# Patient Record
Sex: Female | Born: 1980
Health system: Southern US, Community
[De-identification: ages and names within clinical notes are randomized; demographics above are authoritative.]

---
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
## 2000-11-01 ENCOUNTER — Other Ambulatory Visit: Admission: RE | Admit: 2000-11-01 | Discharge: 2000-11-01 | Payer: Self-pay | Admitting: Gynecology

## 2006-02-20 ENCOUNTER — Emergency Department (HOSPITAL_COMMUNITY): Admission: EM | Admit: 2006-02-20 | Discharge: 2006-02-20 | Payer: Self-pay | Admitting: Emergency Medicine

## 2017-07-12 DIAGNOSIS — R002 Palpitations: Secondary | ICD-10-CM | POA: Diagnosis not present

## 2017-07-12 DIAGNOSIS — R1030 Lower abdominal pain, unspecified: Secondary | ICD-10-CM | POA: Diagnosis not present

## 2017-07-12 DIAGNOSIS — N83201 Unspecified ovarian cyst, right side: Secondary | ICD-10-CM | POA: Diagnosis not present

## 2017-07-12 DIAGNOSIS — R42 Dizziness and giddiness: Secondary | ICD-10-CM | POA: Diagnosis not present

## 2017-07-12 DIAGNOSIS — R55 Syncope and collapse: Secondary | ICD-10-CM | POA: Diagnosis not present

## 2018-07-11 DIAGNOSIS — R509 Fever, unspecified: Secondary | ICD-10-CM | POA: Diagnosis not present

## 2018-07-11 DIAGNOSIS — J029 Acute pharyngitis, unspecified: Secondary | ICD-10-CM | POA: Diagnosis not present

## 2019-02-09 ENCOUNTER — Emergency Department (HOSPITAL_COMMUNITY)
Admission: EM | Admit: 2019-02-09 | Discharge: 2019-02-09 | Disposition: A | Discharge status: Home / Self Care | Payer: Medicare Other | Attending: Emergency Medicine | Admitting: Emergency Medicine

## 2019-02-09 ENCOUNTER — Other Ambulatory Visit: Payer: Self-pay

## 2019-02-09 ENCOUNTER — Emergency Department (HOSPITAL_COMMUNITY): Payer: Medicare Other

## 2019-02-09 ENCOUNTER — Encounter (HOSPITAL_COMMUNITY): Payer: Self-pay | Admitting: Emergency Medicine

## 2019-02-09 DIAGNOSIS — Z79899 Other long term (current) drug therapy: Secondary | ICD-10-CM | POA: Diagnosis not present

## 2019-02-09 DIAGNOSIS — J069 Acute upper respiratory infection, unspecified: Secondary | ICD-10-CM | POA: Diagnosis not present

## 2019-02-09 DIAGNOSIS — J988 Other specified respiratory disorders: Secondary | ICD-10-CM | POA: Diagnosis not present

## 2019-02-09 DIAGNOSIS — B9789 Other viral agents as the cause of diseases classified elsewhere: Secondary | ICD-10-CM

## 2019-02-09 DIAGNOSIS — R0602 Shortness of breath: Secondary | ICD-10-CM | POA: Diagnosis present

## 2019-02-09 LAB — CBC WITH DIFFERENTIAL/PLATELET
Abs Immature Granulocytes: 0.02 10*3/uL (ref 0.00–0.07)
Basophils Absolute: 0 10*3/uL (ref 0.0–0.1)
Basophils Relative: 0 %
Eosinophils Absolute: 0.1 10*3/uL (ref 0.0–0.5)
Eosinophils Relative: 2 %
HCT: 38.4 % (ref 36.0–46.0)
Hemoglobin: 12.7 g/dL (ref 12.0–15.0)
Immature Granulocytes: 0 %
Lymphocytes Relative: 33 %
Lymphs Abs: 2.1 10*3/uL (ref 0.7–4.0)
MCH: 29.7 pg (ref 26.0–34.0)
MCHC: 33.1 g/dL (ref 30.0–36.0)
MCV: 89.7 fL (ref 80.0–100.0)
Monocytes Absolute: 0.6 10*3/uL (ref 0.1–1.0)
Monocytes Relative: 10 %
Neutro Abs: 3.4 10*3/uL (ref 1.7–7.7)
Neutrophils Relative %: 55 %
Platelets: 295 10*3/uL (ref 150–400)
RBC: 4.28 MIL/uL (ref 3.87–5.11)
RDW: 12.9 % (ref 11.5–15.5)
WBC: 6.3 10*3/uL (ref 4.0–10.5)
nRBC: 0 % (ref 0.0–0.2)

## 2019-02-09 LAB — BASIC METABOLIC PANEL
Anion gap: 7 (ref 5–15)
BUN: 13 mg/dL (ref 6–20)
CO2: 23 mmol/L (ref 22–32)
Calcium: 8.8 mg/dL — ABNORMAL LOW (ref 8.9–10.3)
Chloride: 110 mmol/L (ref 98–111)
Creatinine, Ser: 0.91 mg/dL (ref 0.44–1.00)
GFR calc Af Amer: >60 mL/min (ref >60)
GFR calc non Af Amer: >60 mL/min (ref >60)
Glucose, Bld: 95 mg/dL (ref 70–99)
Potassium: 3.6 mmol/L (ref 3.5–5.1)
Sodium: 140 mmol/L (ref 135–145)

## 2019-02-09 LAB — TROPONIN I: Troponin I: <0.03 ng/mL (ref <0.03)

## 2019-02-09 NOTE — ED Notes (Signed)
Bed: WA12 Expected date:  Expected time:  Means of arrival:  Comments: Negative pressure 

## 2019-02-09 NOTE — ED Triage Notes (Addendum)
Patient reports intermittent cough, SOB, fever and chest pain x1 week. Reports mother had similar sx x2 weeks ago.

## 2019-02-09 NOTE — Discharge Instructions (Signed)
You were seen in the ED today for cough, shortness of breath, fever, and chest pain. Your chest pain workup was negative today; your symptoms may be due to COVID 19. Please follow the below recommendations; stay home until you are fever free for 3 days without use of fever reducing medications as well as 7 days after symptom onset. Return to the ED if you begin having worsening shortness of breath especially at rest.      Person Under Monitoring Name: Christina PalmerJennifer B Scott  Location: [address] [place] [id number]   Infection Prevention Recommendations for Individuals Confirmed to have, or Being Evaluated for, 2019 Novel Coronavirus (COVID-19) Infection Who Receive Care at Home  Individuals who are confirmed to have, or are being evaluated for, COVID-19 should follow the prevention steps below until a healthcare provider or local or state health department says they can return to normal activities.  Stay home except to get medical care You should restrict activities outside your home, except for getting medical care. Do not go to work, school, or public areas, and do not use public transportation or taxis.  Call ahead before visiting your doctor Before your medical appointment, call the healthcare provider and tell them that you have, or are being evaluated for, COVID-19 infection. This will help the healthcare providers office take steps to keep other people from getting infected. Ask your healthcare provider to call the local or state health department.  Monitor your symptoms Seek prompt medical attention if your illness is worsening (e.g., difficulty breathing). Before going to your medical appointment, call the healthcare provider and tell them that you have, or are being evaluated for, COVID-19 infection. Ask your healthcare provider to call the local or state health department.  Wear a facemask You should wear a facemask that covers your nose and mouth when you are  in the same room with other people and when you visit a healthcare provider. People who live with or visit you should also wear a facemask while they are in the same room with you.  Separate yourself from other people in your home As much as possible, you should stay in a different room from other people in your home. Also, you should use a separate bathroom, if available.  Avoid sharing household items You should not share dishes, drinking glasses, cups, eating utensils, towels, bedding, or other items with other people in your home. After using these items, you should wash them thoroughly with soap and water.  Cover your coughs and sneezes Cover your mouth and nose with a tissue when you cough or sneeze, or you can cough or sneeze into your sleeve. Throw used tissues in a lined trash can, and immediately wash your hands with soap and water for at least 20 seconds or use an alcohol-based hand rub.  Wash your [employer]hands Wash your hands often and thoroughly with soap and water for at least 20 seconds. You can use an alcohol-based hand sanitizer if soap and water are not available and if your hands are not visibly dirty. Avoid touching your eyes, nose, and mouth with unwashed hands.   Prevention Steps for Caregivers and Household Members of Individuals Confirmed to have, or Being Evaluated for, COVID-19 Infection Being Cared for in the Home  If you live with, or provide care at home for, a person confirmed to have, or being evaluated for, COVID-19 infection please follow these guidelines to prevent infection:  Follow healthcare providers instructions Make sure that you understand  and can help the patient follow any healthcare provider instructions for all care.  Provide for the patients basic needs You should help the patient with basic needs in the home and provide support for getting groceries, prescriptions, and other personal needs.  Monitor the patients symptoms If they are  getting sicker, call his or her medical provider and tell them that the patient has, or is being evaluated for, COVID-19 infection. This will help the healthcare providers office take steps to keep other people from getting infected. Ask the healthcare provider to call the local or state health department.  Limit the number of people who have contact with the patient If possible, have only one caregiver for the patient. Other household members should stay in another home or place of residence. If this is not possible, they should stay in another room, or be separated from the patient as much as possible. Use a separate bathroom, if available. Restrict visitors who do not have an essential need to be in the home.  Keep older adults, very young children, and other sick people away from the patient Keep older adults, very young children, and those who have compromised immune systems or chronic health conditions away from the patient. This includes people with chronic heart, lung, or kidney conditions, diabetes, and cancer.  Ensure good ventilation Make sure that shared spaces in the home have good air flow, such as from an air conditioner or an opened window, weather permitting.  Wash your hands often Wash your hands often and thoroughly with soap and water for at least 20 seconds. You can use an alcohol based hand sanitizer if soap and water are not available and if your hands are not visibly dirty. Avoid touching your eyes, nose, and mouth with unwashed hands. Use disposable paper towels to dry your hands. If not available, use dedicated cloth towels and replace them when they become wet.  Wear a facemask and gloves Wear a disposable facemask at all times in the room and gloves when you touch or have contact with the patients blood, body fluids, and/or secretions or excretions, such as sweat, saliva, sputum, nasal mucus, vomit, urine, or feces.  Ensure the mask fits over your nose and mouth  tightly, and do not touch it during use. Throw out disposable facemasks and gloves after using them. Do not reuse. Wash your hands immediately after removing your facemask and gloves. If your personal clothing becomes contaminated, carefully remove clothing and launder. Wash your hands after handling contaminated clothing. Place all used disposable facemasks, gloves, and other waste in a lined container before disposing them with other household waste. Remove gloves and wash your hands immediately after handling these items.  Do not share dishes, glasses, or other household items with the patient Avoid sharing household items. You should not share dishes, drinking glasses, cups, eating utensils, towels, bedding, or other items with a patient who is confirmed to have, or being evaluated for, COVID-19 infection. After the person uses these items, you should wash them thoroughly with soap and water.  Wash laundry thoroughly Immediately remove and wash clothes or bedding that have blood, body fluids, and/or secretions or excretions, such as sweat, saliva, sputum, nasal mucus, vomit, urine, or feces, on them. Wear gloves when handling laundry from the patient. Read and follow directions on labels of laundry or clothing items and detergent. In general, wash and dry with the warmest temperatures recommended on the label.  Clean all areas the individual has used often Clean  all touchable surfaces, such as counters, tabletops, doorknobs, bathroom fixtures, toilets, phones, keyboards, tablets, and bedside tables, every day. Also, clean any surfaces that may have blood, body fluids, and/or secretions or excretions on them. Wear gloves when cleaning surfaces the patient has come in contact with. Use a diluted bleach solution (e.g., dilute bleach with 1 part bleach and 10 parts water) or a household disinfectant with a label that says EPA-registered for coronaviruses. To make a bleach solution at home, add 1  tablespoon of bleach to 1 quart (4 cups) of water. For a larger supply, add  cup of bleach to 1 gallon (16 cups) of water. Read labels of cleaning products and follow recommendations provided on product labels. Labels contain instructions for safe and effective use of the cleaning product including precautions you should take when applying the product, such as wearing gloves or eye protection and making sure you have good ventilation during use of the product. Remove gloves and wash hands immediately after cleaning.  Monitor yourself for signs and symptoms of illness Caregivers and household members are considered close contacts, should monitor their health, and will be asked to limit movement outside of the home to the extent possible. Follow the monitoring steps for close contacts listed on the symptom monitoring form.   ? If you have additional questions, contact your local health department or call the epidemiologist on call at [phone] (available 24/7). ? This guidance is subject to change. For the most up-to-date guidance from [facility], please refer to their website: [email or web address]

## 2019-02-09 NOTE — ED Provider Notes (Signed)
[facility]-EMERGENCY DEPT Provider Note   CSN: [id number] Arrival date & time: 02/09/19  1658    History   Chief Complaint Chief Complaint  Patient presents with  . Shortness of Breath    HPI Christina Scott is a 38 y.o. female who presents to the ED complaining of intermittent dry cough, shortness of breath, and fever Tmax 100.5 x 1 week. Pt also complains of substernal chest pain intermittently for the past 3 days. Pt states her daughter had a similar dry cough and fever about 2 weeks ago prior to pt's mother having it. Pt has been staying at home and self isolating but was concerned about the chest pain which brought her in. No radiation of pain. No vomiting. No hx of CAD. No prolonged travel, immobilization, injury to the chest. Pt is not on estrogen therapy. She is a non smoker. No hx of active malignancy.       History reviewed. No pertinent past medical history.  There are no active problems to display for this patient.   Past Surgical History:  Procedure Laterality Date  . CESAREAN SECTION       OB History   No obstetric history on file.      Home Medications    Prior to Admission medications   Medication Sig Start Date End Date Taking? Authorizing Provider  acetaminophen (TYLENOL) 325 MG tablet Take 325 mg by mouth daily as needed for fever or headache.   Yes [provider]  aspirin 325 MG EC tablet Take 325 mg by mouth daily.   Yes [provider]  SUMAtriptan (IMITREX) 20 MG/ACT nasal spray Place 20 mg into the nose every 2 (two) hours as needed for migraine or headache. May repeat in 2 hours if headache persists or recurs.   Yes [provider]    Family History No family history on file.  Social History Social History   Tobacco Use  . Smoking status: Not on file  Substance Use Topics  . Alcohol use: Not on file  . Drug use: Not on file     Allergies   Sulfa antibiotics and Hydrocodone    Review of Systems Review of Systems  Constitutional: Positive for fever. Negative for chills.  HENT: Negative for congestion and sore throat.   Eyes: Negative for redness.  Respiratory: Positive for cough and shortness of breath.   Cardiovascular: Positive for chest pain. Negative for palpitations and leg swelling.  Gastrointestinal: Negative for abdominal pain, diarrhea, nausea and vomiting.  Musculoskeletal: Negative for myalgias.  Skin: Negative for rash.  Allergic/Immunologic: Negative for immunocompromised state.  Hematological: Does not bruise/bleed easily.     Physical Exam Updated Vital Signs BP (!) 151/84   Pulse 98   Temp 97.8 F (36.6 C) (Oral)   Resp 20   SpO2 100%   Physical Exam Vitals signs and nursing note reviewed.  Constitutional:      Appearance: She is not ill-appearing.  HENT:     Head: Normocephalic and atraumatic.  Eyes:     Conjunctiva/sclera: Conjunctivae normal.  Neck:     Musculoskeletal: Neck supple.  Cardiovascular:     Rate and Rhythm: Normal rate and regular rhythm.     Pulses: Normal pulses.  Pulmonary:     Effort: Pulmonary effort is normal.     Breath sounds: Normal breath sounds. No decreased breath sounds, wheezing, rhonchi or rales.  Chest:     Chest wall: Tenderness present.    Abdominal:  Palpations: Abdomen is soft.     Tenderness: There is no abdominal tenderness.  Skin:    General: Skin is warm and dry.  Neurological:     Mental Status: She is alert.      ED Treatments / Results  Labs (all labs ordered are listed, but only abnormal results are displayed) Labs Reviewed  BASIC METABOLIC PANEL - Abnormal; Notable for the following components:      Result Value   Calcium 8.8 (*)    All other components within normal limits  TROPONIN I  CBC WITH DIFFERENTIAL/PLATELET    EKG EKG Interpretation  Date/Time:  Thursday February 09 2019 17:34:26 EDT Ventricular Rate:  67 PR Interval:    QRS Duration: 88 QT  Interval:  406 QTC Calculation: 429 R Axis:   53 Text Interpretation:  Sinus rhythm Borderline T abnormalities, anterior leads No significant change since last tracing Confirmed by Linwood DibblesKnapp, Jon [phone]([id number]) on 02/09/2019 5:54:25 PM   Radiology Dg Chest Port 1 View  Result Date: 02/09/2019 CLINICAL DATA:  Intermittent cough shortness of breath fever and chest pain EXAM: PORTABLE CHEST 1 VIEW COMPARISON:  02/20/2006 FINDINGS: The heart size and mediastinal contours are within normal limits. Both lungs are clear. The visualized skeletal structures are unremarkable. IMPRESSION: No active disease. Electronically Signed   By: Jasmine PangKim  Fujinaga M.D.   On: 02/09/2019 18:19    Procedures Procedures (including critical care time)  Medications Ordered in ED Medications - No data to display   Initial Impression / Assessment and Plan / ED Course  I have reviewed the triage vital signs and the nursing notes.  Pertinent labs & imaging results that were available during my care of the patient were reviewed by me and considered in my medical decision making (see chart for details).    Pt is a 38 year old female who presents to the ED with cough, SOB, fever, and chest pain. Chest pain reproducible. Has had contact with family members with similar symptoms; high likelihood that pt has Covid. Satting 100% on RA. LCTAB. Pt was able to ambulate by herself to her room without issue. Will workup chest pain to rule out ACS prior to discharge although low suspicion for it at this time. Likely having pain from coughing. PERC negative; no need for d-dimer.   EKG without signs of ischemia; CXR without acute abnormality; troponin negative and remainder of labs within normal limits. Do not feel pt needs delta trop at this time; will discharge home with diagnosis of viral like illness and strict instructions to self isolate for 3 days without fever/improvement of symptoms and at least 7 days after symptom onset. Return precautions  discussed with her including worsening shortness of breath. Pt in agreement with plan at this time and stable for discharge home.   Christina Scott was evaluated in Emergency Department on 02/09/2019 for the symptoms described in the history of present illness. She was evaluated in the context of the global COVID-19 pandemic, which necessitated consideration that the patient might be at risk for infection with the SARS-CoV-2 virus that causes COVID-19. Institutional protocols and algorithms that pertain to the evaluation of patients at risk for COVID-19 are in a state of rapid change based on information released by regulatory bodies including the CDC and federal and state organizations. These policies and algorithms were followed during the patient's care in the ED.        Final Clinical Impressions(s) / ED Diagnoses   Final diagnoses:  Viral  respiratory illness    ED Discharge Orders    None       Tanda Rockers, Cordelia Poche 02/09/19 1931    Linwood Dibbles, MD 02/09/19 [phone]

## 2020-01-27 IMAGING — DX PORTABLE CHEST - 1 VIEW
1 series · 1 of 1 positions shown · non-contrast
Comparison: 02/20/2006

CLINICAL DATA: Intermittent cough shortness of breath fever and
chest pain

EXAM:
PORTABLE CHEST 1 VIEW

[chest ap]
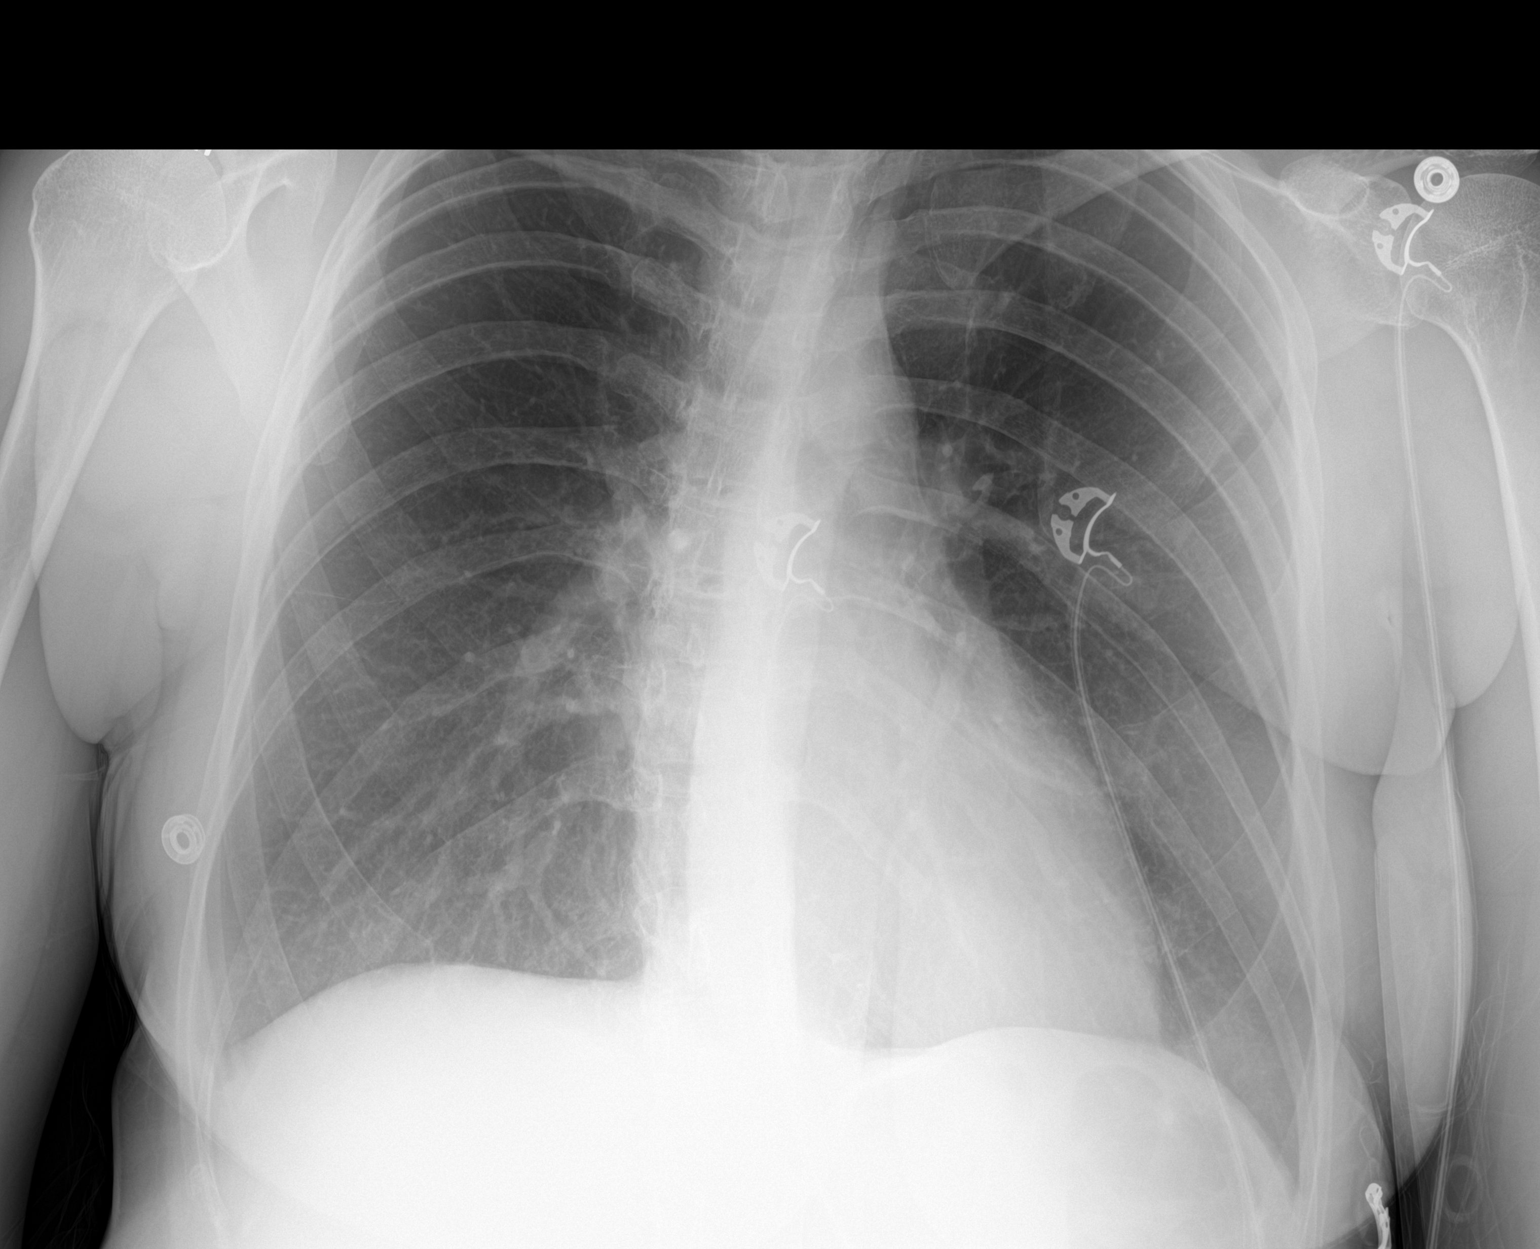

[1 of 1 positions shown; findings below may reference images not displayed]

FINDINGS: The heart size and mediastinal contours are within normal limits.
Both lungs are clear. The visualized skeletal structures are
unremarkable.
IMPRESSION: No active disease.

## 2023-11-18 ENCOUNTER — Encounter: Payer: Self-pay | Admitting: Internal Medicine

## 2024-01-27 ENCOUNTER — Ambulatory Visit: Payer: No Typology Code available for payment source | Admitting: Internal Medicine
